# Patient Record
Sex: Male | Born: 1985 | Race: Black or African American | Hispanic: No | Marital: Single | Smoking: Never smoker
Health system: Southern US, Community
[De-identification: ages and names within clinical notes are randomized; demographics above are authoritative.]

---
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
## 2009-06-30 ENCOUNTER — Emergency Department: Payer: Self-pay | Admitting: Emergency Medicine

## 2012-11-30 ENCOUNTER — Emergency Department: Payer: Self-pay | Admitting: Emergency Medicine

## 2013-07-03 ENCOUNTER — Emergency Department: Payer: Self-pay | Admitting: Emergency Medicine

## 2013-11-25 ENCOUNTER — Emergency Department: Payer: Self-pay | Admitting: Emergency Medicine

## 2014-07-12 ENCOUNTER — Emergency Department: Payer: Self-pay | Admitting: Emergency Medicine

## 2014-07-12 LAB — CBC WITH DIFFERENTIAL/PLATELET
Basophil #: 0 10*3/uL (ref 0.0–0.1)
Basophil %: 0.5 %
Eosinophil #: 0.1 10*3/uL (ref 0.0–0.7)
Eosinophil %: 1 %
HCT: 40.9 % (ref 40.0–52.0)
HGB: 13.5 g/dL (ref 13.0–18.0)
Lymphocyte #: 5.2 10*3/uL — ABNORMAL HIGH (ref 1.0–3.6)
Lymphocyte %: 63.2 %
MCH: 30 pg (ref 26.0–34.0)
MCHC: 32.9 g/dL (ref 32.0–36.0)
MCV: 91 fL (ref 80–100)
Monocyte #: 0.7 x10 3/mm (ref 0.2–1.0)
Monocyte %: 8.7 %
Neutrophil #: 2.2 10*3/uL (ref 1.4–6.5)
Neutrophil %: 26.6 %
Platelet: 237 10*3/uL (ref 150–440)
RBC: 4.49 10*6/uL (ref 4.40–5.90)
RDW: 14 % (ref 11.5–14.5)
WBC: 8.3 10*3/uL (ref 3.8–10.6)

## 2014-07-12 LAB — COMPREHENSIVE METABOLIC PANEL
Albumin: 3.9 g/dL (ref 3.4–5.0)
Alkaline Phosphatase: 54 U/L
Anion Gap: 9 (ref 7–16)
BUN: 13 mg/dL (ref 7–18)
Bilirubin,Total: 0.5 mg/dL (ref 0.2–1.0)
Calcium, Total: 8 mg/dL — ABNORMAL LOW (ref 8.5–10.1)
Chloride: 107 mmol/L (ref 98–107)
Co2: 26 mmol/L (ref 21–32)
Creatinine: 1.25 mg/dL (ref 0.60–1.30)
EGFR (African American): >60
EGFR (Non-African Amer.): >60
Glucose: 138 mg/dL — ABNORMAL HIGH (ref 65–99)
Osmolality: 285 (ref 275–301)
Potassium: 3.5 mmol/L (ref 3.5–5.1)
SGOT(AST): 40 U/L — ABNORMAL HIGH (ref 15–37)
SGPT (ALT): 26 U/L
Sodium: 142 mmol/L (ref 136–145)
Total Protein: 7.4 g/dL (ref 6.4–8.2)

## 2014-07-12 LAB — PROTIME-INR
INR: 1.1
Prothrombin Time: 13.7 secs (ref 11.5–14.7)

## 2014-07-19 ENCOUNTER — Emergency Department: Payer: Self-pay | Admitting: Internal Medicine

## 2015-06-04 ENCOUNTER — Emergency Department: Payer: Managed Care, Other (non HMO)

## 2015-06-04 ENCOUNTER — Emergency Department
Admission: EM | Admit: 2015-06-04 | Discharge: 2015-06-04 | Disposition: A | Discharge status: Home / Self Care | Payer: Managed Care, Other (non HMO) | Attending: Emergency Medicine | Admitting: Emergency Medicine

## 2015-06-04 DIAGNOSIS — Y998 Other external cause status: Secondary | ICD-10-CM | POA: Diagnosis not present

## 2015-06-04 DIAGNOSIS — S9031XA Contusion of right foot, initial encounter: Secondary | ICD-10-CM | POA: Insufficient documentation

## 2015-06-04 DIAGNOSIS — W109XXA Fall (on) (from) unspecified stairs and steps, initial encounter: Secondary | ICD-10-CM | POA: Diagnosis not present

## 2015-06-04 DIAGNOSIS — S99921A Unspecified injury of right foot, initial encounter: Secondary | ICD-10-CM | POA: Diagnosis present

## 2015-06-04 DIAGNOSIS — Y9301 Activity, walking, marching and hiking: Secondary | ICD-10-CM | POA: Insufficient documentation

## 2015-06-04 DIAGNOSIS — Y9289 Other specified places as the place of occurrence of the external cause: Secondary | ICD-10-CM | POA: Diagnosis not present

## 2015-06-04 MED ORDER — IBUPROFEN 800 MG PO TABS: 800.0000 mg | ORAL_TABLET | Freq: Three times a day (TID) | ORAL | Status: DC

## 2015-06-04 NOTE — ED Notes (Signed)
Patient was walking yesterday and slipped down the steps and injured right foot. Pain is localized to right heel. 3+ dorsalis pedis.  No swelling noted upon assessment.

## 2015-06-04 NOTE — Discharge Instructions (Signed)
Contusion °A contusion is a deep bruise. Contusions happen when an injury causes bleeding under the skin. Signs of bruising include pain, puffiness (swelling), and discolored skin. The contusion may turn blue, purple, or yellow. °HOME CARE  °· Put ice on the injured area. °¨ Put ice in a plastic bag. °¨ Place a towel between your skin and the bag. °¨ Leave the ice on for 15-20 minutes, 03-04 times a day. °· Only take medicine as told by your doctor. °· Rest the injured area. °· If possible, raise (elevate) the injured area to lessen puffiness. °GET HELP RIGHT AWAY IF:  °· You have more bruising or puffiness. °· You have pain that is getting worse. °· Your puffiness or pain is not helped by medicine. °MAKE SURE YOU:  °· Understand these instructions. °· Will watch your condition. °· Will get help right away if you are not doing well or get worse. °Document Released: 02/08/2008 Document Revised: 11/14/2011 Document Reviewed: 06/27/2011 °ExitCare® Patient Information ©2015 [employer]. This information is not intended to replace advice given to you by your health care provider. Make sure you discuss any questions you have with your health care provider. ° °

## 2015-06-04 NOTE — ED Provider Notes (Signed)
[facility] Emergency Department Provider Note  ____________________________________________  Time seen: Approximately 12:40 PM  I have reviewed the triage vital signs and the nursing notes.   HISTORY  Chief Complaint Foot Pain   HPI Dalton Werner is a 29 y.o. male states that he was walking yesterday and slipped down some steps with an injury to his right heel. He continues to have pain this morning. He has not taken any over-the-counter medication for pain. He did not use ice or elevate last evening. He states pain was worse when he tried to walk today. He denies any previous injury to his foot, howeverhe states that "top of his foot" has been hurting for a couple weeks prior to this particular injury. He did not see any medical attention for that pain. Currently his pain is 10 out of 10. Pain is worse with weightbearing.   No past medical history on file.  There are no active problems to display for this patient.   No past surgical history on file.  Current Outpatient Rx  Name  Route  Sig  Dispense  Refill  . ibuprofen (ADVIL,MOTRIN) 800 MG tablet   Oral   Take 1 tablet (800 mg total) by mouth 3 (three) times daily.   30 tablet   0     Allergies Review of patient's allergies indicates no known allergies.  No family history on file.  Social History Social History  Substance Use Topics  . Smoking status: Never Smoker   . Smokeless tobacco: Not on file  . Alcohol Use: Yes    Review of Systems Constitutional: No fever/chills Cardiovascular: Denies chest pain. Respiratory: Denies shortness of breath. Gastrointestinal:   No nausea, no vomiting.  Genitourinary: Negative for dysuria. Musculoskeletal: Negative for back pain. Right foot pain positive Skin: Negative for rash. Neurological: Negative for headaches, focal weakness or numbness.  10-point ROS otherwise negative.  ____________________________________________   PHYSICAL  EXAM:  VITAL SIGNS: ED Triage Vitals  Enc Vitals Group     BP 06/04/15 1142 137/73 mmHg     Pulse Rate 06/04/15 1142 63     Resp 06/04/15 1142 18     Temp 06/04/15 1142 98.2 F (36.8 C)     Temp Source 06/04/15 1142 Oral     SpO2 06/04/15 1142 99 %     Weight 06/04/15 1142 240 lb (108.863 kg)     Height 06/04/15 1142  (1.803 m)     Head Cir --      Peak Flow --      Pain Score 06/04/15 1143 10     Pain Loc --      Pain Edu? --      Excl. in GC? --     Constitutional: Alert and oriented. Well appearing and in no acute distress. Eyes: Conjunctivae are normal. PERRL. EOMI. Head: Atraumatic. Nose: No congestion/rhinnorhea. Neck: No stridor.   Cardiovascular: Normal rate, regular rhythm. Grossly normal heart sounds.  Good peripheral circulation. Respiratory: Normal respiratory effort.  No retractions. Lungs CTAB. Gastrointestinal: Soft and nontender. No distention. Musculoskeletal: Right foot no gross deformity. There is moderate tenderness on palpation of the plantar aspect of the calcaneus. No edema or ecchymosis is noted. Ankle nontender bilaterally and no soft tissue edema.   No joint effusions. Neurologic:  Normal speech and language. No gross focal neurologic deficits are appreciated. No gait instability. Skin:  Skin is warm, dry and intact. No rash noted. No abrasions, ecchymosis or erythema noted. Psychiatric: Mood  and affect are normal. Speech and behavior are normal.  ____________________________________________   LABS (all labs ordered are listed, but only abnormal results are displayed)  Labs Reviewed - No data to display   RADIOLOGY  Right foot x-ray per radiologist reviewed by me show no fracture ____________________________________________   PROCEDURES  Procedure(s) performed: None  Critical Care performed: No  ____________________________________________   INITIAL IMPRESSION / ASSESSMENT AND PLAN / ED COURSE  Pertinent labs & imaging  results that were available during my care of the patient were reviewed by me and considered in my medical decision making (see chart for details).  Patient was placed on ibuprofen 800 mg 3 times a day. I, Tommi Rumps, personally viewed and evaluated these images (plain radiographs) as part of my medical decision making.  ____________________________________________   FINAL CLINICAL IMPRESSION(S) / ED DIAGNOSES  Final diagnoses:  Contusion of right foot, initial encounter      Tommi Rumps, PA-C 06/04/15 1335  Myrna Blazer, MD 06/04/15 1431

## 2017-02-02 ENCOUNTER — Emergency Department
Admission: EM | Admit: 2017-02-02 | Discharge: 2017-02-02 | Disposition: A | Discharge status: Home / Self Care | Payer: Managed Care, Other (non HMO) | Attending: Emergency Medicine | Admitting: Emergency Medicine

## 2017-02-02 ENCOUNTER — Emergency Department: Payer: Managed Care, Other (non HMO)

## 2017-02-02 ENCOUNTER — Encounter: Payer: Self-pay | Admitting: *Deleted

## 2017-02-02 DIAGNOSIS — W208XXA Other cause of strike by thrown, projected or falling object, initial encounter: Secondary | ICD-10-CM | POA: Insufficient documentation

## 2017-02-02 DIAGNOSIS — Y990 Civilian activity done for income or pay: Secondary | ICD-10-CM | POA: Insufficient documentation

## 2017-02-02 DIAGNOSIS — S9032XA Contusion of left foot, initial encounter: Secondary | ICD-10-CM | POA: Insufficient documentation

## 2017-02-02 DIAGNOSIS — Y9389 Activity, other specified: Secondary | ICD-10-CM | POA: Insufficient documentation

## 2017-02-02 DIAGNOSIS — Y9259 Other trade areas as the place of occurrence of the external cause: Secondary | ICD-10-CM | POA: Insufficient documentation

## 2017-02-02 MED ORDER — OXYCODONE-ACETAMINOPHEN 5-325 MG PO TABS
1.0000 | ORAL_TABLET | Freq: Once | ORAL | Status: AC
  Administered 2017-02-02: 1 via ORAL
  Filled 2017-02-02: qty 1

## 2017-02-02 MED ORDER — LIDOCAINE 5 % EX PTCH
1.0000 | MEDICATED_PATCH | CUTANEOUS | Status: DC
  Administered 2017-02-02: 1 via TRANSDERMAL
  Filled 2017-02-02: qty 1

## 2017-02-02 MED ORDER — IBUPROFEN 800 MG PO TABS: 800.0000 mg | ORAL_TABLET | Freq: Three times a day (TID) | ORAL | 0 refills | Status: DC | PRN

## 2017-02-02 NOTE — ED Triage Notes (Addendum)
Pt states he dropped a 30lb weight on left foot today at work.  Pt was wearing steel toed boot.  Pt has pain and swelling to foot.  States WC
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
## 2017-02-02 NOTE — ED Notes (Signed)
Workers comp completed 

## 2018-02-07 ENCOUNTER — Telehealth: Payer: Self-pay
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
## 2018-02-09 ENCOUNTER — Encounter: Payer: Self-pay | Admitting: Podiatry

## 2018-02-14 NOTE — Progress Notes (Signed)
This encounter was created in error - please disregard.

## 2018-11-19 ENCOUNTER — Other Ambulatory Visit: Payer: Self-pay

## 2018-11-19 ENCOUNTER — Encounter: Payer: Self-pay | Admitting: Emergency Medicine

## 2018-11-19 ENCOUNTER — Emergency Department
Admission: EM | Admit: 2018-11-19 | Discharge: 2018-11-19 | Disposition: A | Discharge status: Home / Self Care | Payer: No Typology Code available for payment source | Attending: Emergency Medicine | Admitting: Emergency Medicine

## 2018-11-19 DIAGNOSIS — Y9389 Activity, other specified: Secondary | ICD-10-CM | POA: Diagnosis not present

## 2018-11-19 DIAGNOSIS — T2016XA Burn of first degree of forehead and cheek, initial encounter: Secondary | ICD-10-CM | POA: Insufficient documentation

## 2018-11-19 DIAGNOSIS — Y929 Unspecified place or not applicable: Secondary | ICD-10-CM | POA: Insufficient documentation

## 2018-11-19 DIAGNOSIS — T2006XA Burn of unspecified degree of forehead and cheek, initial encounter: Secondary | ICD-10-CM | POA: Diagnosis present

## 2018-11-19 DIAGNOSIS — X18XXXA Contact with other hot metals, initial encounter: Secondary | ICD-10-CM | POA: Diagnosis not present

## 2018-11-19 DIAGNOSIS — Y990 Civilian activity done for income or pay: Secondary | ICD-10-CM | POA: Insufficient documentation

## 2018-11-19 DIAGNOSIS — T300 Burn of unspecified body region, unspecified degree: Secondary | ICD-10-CM

## 2018-11-19 MED ORDER — BACITRACIN-NEOMYCIN-POLYMYXIN 400-5-5000 EX OINT
TOPICAL_OINTMENT | Freq: Once | CUTANEOUS | Status: AC
  Administered 2018-11-19: 1 via TOPICAL
  Filled 2018-11-19: qty 2
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
## 2018-11-19 NOTE — ED Notes (Signed)
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
## 2019-02-18 ENCOUNTER — Other Ambulatory Visit: Payer: Self-pay

## 2019-02-18 ENCOUNTER — Emergency Department: Payer: Commercial Managed Care - PPO

## 2019-02-18 ENCOUNTER — Emergency Department
Admission: EM | Admit: 2019-02-18 | Discharge: 2019-02-18 | Disposition: A | Discharge status: Home / Self Care | Payer: Commercial Managed Care - PPO | Attending: Student in an Organized Health Care Education/Training Program | Admitting: Student in an Organized Health Care Education/Training Program

## 2019-02-18 ENCOUNTER — Encounter: Payer: Self-pay | Admitting: Emergency Medicine

## 2019-02-18 DIAGNOSIS — M545 Low back pain: Secondary | ICD-10-CM | POA: Diagnosis present

## 2019-02-18 DIAGNOSIS — M533 Sacrococcygeal disorders, not elsewhere classified: Secondary | ICD-10-CM | POA: Insufficient documentation

## 2019-02-18 MED ORDER — TRAMADOL HCL 50 MG PO TABS: 50.0000 mg | ORAL_TABLET | Freq: Four times a day (QID) | ORAL | 0 refills | Status: DC | PRN

## 2019-02-18 MED ORDER — NAPROXEN 500 MG PO TABS: 500.0000 mg | ORAL_TABLET | Freq: Two times a day (BID) | ORAL | Status: DC

## 2019-02-18 NOTE — ED Provider Notes (Signed)
[facility] Emergency Department Provider Note   ____________________________________________   First MD Initiated Contact with Patient 02/18/19 1118     (approximate)  I have reviewed the triage vital signs and the nursing notes.   HISTORY  Chief Complaint Back Pain    HPI Dalton Werner is a 33 y.o. male patient complain of 1 year of coccyx pain that increased with sitting on hard surface.  Patient denies trauma to the area.  Patient did pain increased yesterday.  Patient denies radicular component to pain.  Patient denies bladder bowel dysfunction.  Patient rates pain as a 5/10.  Patient described the pain is "sharp".  No palliative measure for complaint.         History reviewed. No pertinent past medical history.  There are no active problems to display for this patient.   History reviewed. No pertinent surgical history.  Prior to Admission medications   Medication Sig Start Date End Date Taking? Authorizing Provider  naproxen (NAPROSYN) 500 MG tablet Take 1 tablet (500 mg total) by mouth 2 (two) times daily with a meal. 02/18/19   Joni ReiningSmith, Bolden Hagerman K, PA-C  traMADol (ULTRAM) 50 MG tablet Take 1 tablet (50 mg total) by mouth every 6 (six) hours as needed. 02/18/19   Joni ReiningSmith, Sareena Odeh K, PA-C    Allergies Patient has no known allergies.  No family history on file.  Social History Social History   Tobacco Use   Smoking status: Never Smoker   Smokeless tobacco: Never Used  Substance Use Topics   Alcohol use: Yes   Drug use: No    Review of Systems Constitutional: No fever/chills Eyes: No visual changes. ENT: No sore throat. Cardiovascular: Denies chest pain. Respiratory: Denies shortness of breath. Gastrointestinal: No abdominal pain.  No nausea, no vomiting.  No diarrhea.  No constipation. Genitourinary: Negative for dysuria. Musculoskeletal: Coccyx pain. Skin: Negative for rash. Neurological: Negative for headaches, focal weakness  or numbness.   ____________________________________________   PHYSICAL EXAM:  VITAL SIGNS: ED Triage Vitals  Enc Vitals Group     BP 02/18/19 1016 (!) 112/98     Pulse Rate 02/18/19 1016 71     Resp --      Temp 02/18/19 1016 98.2 F (36.8 C)     Temp Source 02/18/19 1016 Oral     SpO2 02/18/19 1016 98 %     Weight 02/18/19 1019 260 lb (117.9 kg)     Height 02/18/19 1019 5\' 11"  (1.803 m)     Head Circumference --      Peak Flow --      Pain Score 02/18/19 1019 5     Pain Loc --      Pain Edu? --      Excl. in GC? --    Constitutional: Alert and oriented. Well appearing and in no acute distress. Cardiovascular: Normal rate, regular rhythm. Grossly normal heart sounds.  Good peripheral circulation. Respiratory: Normal respiratory effort.  No retractions. Lungs CTAB. Gastrointestinal: Soft and nontender. No distention. No abdominal bruits. No CVA tenderness. Genitourinary: Deferred Musculoskeletal: No obvious lumbar spine deformity.  Patient has moderate guarding palpation L5-S1.  Patient has full equal range of motion.  No lower extremity tenderness nor edema.  Patient has negative bilateral straight leg test. Neurologic:  Normal speech and language. No gross focal neurologic deficits are appreciated. No gait instability. Skin:  Skin is warm, dry and intact. No rash noted. Psychiatric: Mood and affect are normal. Speech and behavior are  normal.  ____________________________________________   LABS (all labs ordered are listed, but only abnormal results are displayed)  Labs Reviewed - No data to display ____________________________________________  EKG   ____________________________________________  RADIOLOGY  ED MD interpretation:    Official radiology report(s): Dg Lumbar Spine 2-3 Views  Result Date: 02/18/2019 CLINICAL DATA:  33 year old male with a history of lower back pain EXAM: LUMBAR SPINE - 2-3 VIEW COMPARISON:  None. FINDINGS: Lumbar Spine: Lumbar  vertebral elements maintain normal alignment without evidence of anterolisthesis, retrolisthesis, subluxation. No acute fracture line identified. Lumbar vertebral body heights maintained. Mild wedging of the T12 level, likely chronic. Disc space heights maintained with no significant degenerative disc disease or endplate changes. No significant facet changes. Unremarkable appearance of the visualized abdomen. IMPRESSION: Negative for acute fracture or malalignment of the lumbar spine. Electronically Signed   By: Corrie Mckusick D.O.   On: 02/18/2019 12:00    ____________________________________________   PROCEDURES  Procedure(s) performed (including Critical Care):  Procedures   ____________________________________________   INITIAL IMPRESSION / ASSESSMENT AND PLAN / ED COURSE  As part of my medical decision making, I reviewed the following data within the [place]         Patient presents 1 year of constant pain.  Patient denies cauda equina signs or symptoms.  Differential consist of degenerative changes versus occult fracture.  Physical exam was grossly unremarkable.  Imaging shows no acute findings.  Patient given discharge care instructions.  Patient state has appoint with PCP in 3 days.     ____________________________________________   FINAL CLINICAL IMPRESSION(S) / ED DIAGNOSES  Final diagnoses:  Coccydynia     ED Discharge Orders         Ordered    naproxen (NAPROSYN) 500 MG tablet  2 times daily with meals     02/18/19 1220    traMADol (ULTRAM) 50 MG tablet  Every 6 hours PRN     02/18/19 1220           Note:  This document was prepared using Dragon voice recognition software and may include unintentional dictation errors.    Sable Feil, PA-C 02/18/19 1223    Merlyn Lot, MD 02/18/19 [phone]

## 2019-02-18 NOTE — Discharge Instructions (Signed)
Advised to purchase doughnut seat cushion.  Follow discharge care instruction take medication as directed.  Follow-up with scheduled doctor appointment.

## 2019-02-18 NOTE — ED Notes (Signed)
See triage note  Presents with back pain  States pain is mainly at tailbone  Worse with sitting still  Improves with ambulation   Denies any injury

## 2019-02-18 NOTE — ED Triage Notes (Signed)
Pt reports for the past year or more he will have pain in his lower back, tailbone area when he sits on a hard surface. Pt states yesterday it got worse and was hurting while he was laying. Pt denies injuries or other sx's.

## 2019-02-28 ENCOUNTER — Ambulatory Visit: Payer: Commercial Managed Care - PPO | Admitting: Adult Health

## 2019-02-28 ENCOUNTER — Other Ambulatory Visit: Payer: Self-pay

## 2019-02-28 ENCOUNTER — Encounter: Payer: Self-pay | Admitting: Adult Health

## 2019-02-28 VITALS — BP 124/89 | HR 79 | Resp 16 | Ht 71.0 in | Wt 272.0 lb

## 2019-02-28 DIAGNOSIS — M545 Low back pain, unspecified: Secondary | ICD-10-CM

## 2019-02-28 DIAGNOSIS — M533 Sacrococcygeal disorders, not elsewhere classified: Secondary | ICD-10-CM

## 2019-02-28 DIAGNOSIS — E6609 Other obesity due to excess calories: Secondary | ICD-10-CM

## 2019-02-28 DIAGNOSIS — G8929 Other chronic pain: Secondary | ICD-10-CM | POA: Diagnosis not present

## 2019-02-28 DIAGNOSIS — Z6837 Body mass index (BMI) 37.0-37.9, adult: Secondary | ICD-10-CM

## 2019-02-28 MED ORDER — NAPROXEN 500 MG PO TABS: 500.0000 mg | ORAL_TABLET | Freq: Two times a day (BID) | ORAL | Status: DC

## 2019-02-28 NOTE — Progress Notes (Signed)
[facility] [address] [place], [place] [address]  Internal MEDICINE  Office Visit Note  Patient Name: Dalton FergusonByron Werner  [id number] 24, 1987  [id number]  Date of Service: 02/28/2019   Complaints/HPI Pt is here for establishment of PCP. Chief Complaint  Patient presents with  . Medical Management of Chronic Issues    sitting on hard service having shooting pain in tailbone and rectum pain , ED gave some medications , having pressure in the rectum   . New Patient (Initial Visit)   HPI Pt is here to establish care.  He reports no significant medical history except for a work injury from last year that gives him chronic back pain and tightness. About 2 weeks ago he noticed some sharp pains come from his lower back and rectal area.  He denies any trauma.  He denies any tingling but does report some numbness at times. He says it feels rectal pressure at times like he needs to have a bowel movement, but is unable to.  He was seen in the ED for this, and diagnosed with Coccydynia.  He was given naproxen and tramadol and told to rest.  Pt reports he chews tobacco, drinks alcohol occasionally, and denies illicit drug use.   Current Medication: Outpatient Encounter Medications as of 02/28/2019  Medication Sig  . naproxen (NAPROSYN) 500 MG tablet Take 1 tablet (500 mg total) by mouth 2 (two) times daily with a meal.  . traMADol (ULTRAM) 50 MG tablet Take 1 tablet (50 mg total) by mouth every 6 (six) hours as needed.   No facility-administered encounter medications on file as of 02/28/2019.     Surgical History: History reviewed. No pertinent surgical history.  Medical History: History reviewed. No pertinent past medical history.  Family History: Family History  Problem Relation Age of Onset  . Hypertension Mother   . Liver cancer Father     Social History   Socioeconomic History  . Marital status: Single    Spouse name: Not on file  . Number of children: Not on file  . Years of  education: Not on file  . Highest education level: Not on file  Occupational History  . Not on file  Social Needs  . Financial resource strain: Not on file  . Food insecurity    Worry: Not on file    Inability: Not on file  . Transportation needs    Medical: Not on file    Non-medical: Not on file  Tobacco Use  . Smoking status: Never Smoker  . Smokeless tobacco: Current User    Types: Chew  Substance and Sexual Activity  . Alcohol use: Yes  . Drug use: No  . Sexual activity: Not on file  Lifestyle  . Physical activity    Days per week: Not on file    Minutes per session: Not on file  . Stress: Not on file  Relationships  . Social Musicianconnections    Talks on phone: Not on file    Gets together: Not on file    Attends religious service: Not on file    Active member of club or organization: Not on file    Attends meetings of clubs or organizations: Not on file    Relationship status: Not on file  . Intimate partner violence    Fear of current or ex partner: Not on file    Emotionally abused: Not on file    Physically abused: Not on file    Forced sexual activity: Not  on file  Other Topics Concern  . Not on file  Social History Narrative  . Not on file     Review of Systems  Constitutional: Negative.  Negative for chills, fatigue and unexpected weight change.  HENT: Negative.  Negative for congestion, rhinorrhea, sneezing and sore throat.   Eyes: Negative for redness.  Respiratory: Negative.  Negative for cough, chest tightness and shortness of breath.   Cardiovascular: Negative.  Negative for chest pain and palpitations.  Gastrointestinal: Negative.  Negative for abdominal pain, constipation, diarrhea, nausea and vomiting.  Endocrine: Negative.   Genitourinary: Negative.  Negative for dysuria and frequency.  Musculoskeletal: Negative.  Negative for arthralgias, back pain, joint swelling and neck pain.  Skin: Negative.  Negative for rash.  Allergic/Immunologic:  Negative.   Neurological: Negative.  Negative for tremors and numbness.  Hematological: Negative for adenopathy. Does not bruise/bleed easily.  Psychiatric/Behavioral: Negative.  Negative for behavioral problems, sleep disturbance and suicidal ideas. The patient is not nervous/anxious.     Vital Signs: BP 124/89   Pulse 79   Resp 16   Ht 5\' 11"  (1.803 m)   Wt 272 lb (123.4 kg)   SpO2 95%   BMI 37.94 kg/m    Physical Exam Vitals signs and nursing note reviewed.  Constitutional:      General: He is not in acute distress.    Appearance: He is well-developed. He is not diaphoretic.  HENT:     Head: Normocephalic and atraumatic.     Mouth/Throat:     Pharynx: No oropharyngeal exudate.  Eyes:     Pupils: Pupils are equal, round, and reactive to light.  Neck:     Musculoskeletal: Normal range of motion and neck supple.     Thyroid: No thyromegaly.     Vascular: No JVD.     Trachea: No tracheal deviation.  Cardiovascular:     Rate and Rhythm: Normal rate and regular rhythm.     Heart sounds: Normal heart sounds. No murmur. No friction rub. No gallop.   Pulmonary:     Effort: Pulmonary effort is normal. No respiratory distress.     Breath sounds: Normal breath sounds. No wheezing or rales.  Chest:     Chest wall: No tenderness.  Abdominal:     Palpations: Abdomen is soft.     Tenderness: There is no abdominal tenderness. There is no guarding.  Musculoskeletal: Normal range of motion.     Comments: Lower back pain with palpation  Lymphadenopathy:     Cervical: No cervical adenopathy.  Skin:    General: Skin is warm and dry.  Neurological:     Mental Status: He is alert and oriented to person, place, and time.     Cranial Nerves: No cranial nerve deficit.  Psychiatric:        Behavior: Behavior normal.        Thought Content: Thought content normal.        Judgment: Judgment normal.    Assessment/Plan: 1. Coccydynia Continue present treatment, continue to rest, if no  better in 2 weeks follow up in office.   2. Chronic bilateral low back pain without sciatica Continue to use naproxen for low back pain as directed.  We discussed to side effects of long term use of naproxen, pt verbalized understanding risk of Gi bleed etc.  - naproxen (NAPROSYN) 500 MG tablet; Take 1 tablet (500 mg total) by mouth 2 (two) times daily with a meal.  Dispense: 60 tablet; Refill: 00  3. Class 2 obesity due to excess calories without serious comorbidity with body mass index (BMI) of 37.0 to 37.9 in adult Obesity Counseling: Risk Assessment: An assessment of behavioral risk factors was made today and includes lack of exercise sedentary lifestyle, lack of portion control and poor dietary habits.  Risk Modification Advice: She was counseled on portion control guidelines. Restricting daily caloric intake to. . The detrimental long term effects of obesity on her health and ongoing poor compliance was also discussed with the patient.    General Counseling: Tobe SosByron verbalizes understanding of the findings of todays visit and agrees with plan of treatment. I have discussed any further diagnostic evaluation that may be needed or ordered today. We also reviewed his medications today. he has been encouraged to call the office with any questions or concerns that should arise related to todays visit.  No orders of the defined types were placed in this encounter.   No orders of the defined types were placed in this encounter.   Time spent: 30 Minutes   This patient was seen by Blima LedgerAdam Gisselle Galvis AGNP-C in Collaboration with Dr Lyndon CodeFozia M Khan as a part of collaborative care agreement  Johnna AcostaAdam J. Soha Thorup AGNP-C Internal Medicine

## 2019-03-06 ENCOUNTER — Ambulatory Visit: Payer: Self-pay | Admitting: Adult Health

## 2019-03-13 ENCOUNTER — Telehealth: Payer: Self-pay | Admitting: Adult Health

## 2019-03-13 NOTE — Telephone Encounter (Signed)
Provider (adam scarboro) has paperwork pt dropped off to be completed for his work place. Beth

## 2019-03-19 ENCOUNTER — Telehealth: Payer: Self-pay

## 2019-03-19 NOTE — Telephone Encounter (Signed)
Left message and advised patient his paperwork is ready for pick up and in the future if he has flare up's he needs to let our office know so we can document for the future if he will need dates filled out we need the documentation in chart. Beth

## 2019-03-26 ENCOUNTER — Other Ambulatory Visit: Payer: Self-pay | Admitting: Adult Health

## 2019-03-26 DIAGNOSIS — G8929 Other chronic pain: Secondary | ICD-10-CM

## 2019-03-26 NOTE — Telephone Encounter (Signed)
Pt needs appt for further refills. 

## 2019-04-04 ENCOUNTER — Other Ambulatory Visit: Payer: Self-pay

## 2019-04-04 ENCOUNTER — Encounter: Payer: Self-pay | Admitting: Adult Health

## 2019-04-04 ENCOUNTER — Ambulatory Visit (INDEPENDENT_AMBULATORY_CARE_PROVIDER_SITE_OTHER): Payer: Commercial Managed Care - PPO | Admitting: Adult Health

## 2019-04-04 VITALS — BP 133/84 | HR 71 | Resp 16 | Ht 71.0 in | Wt 279.0 lb

## 2019-04-04 DIAGNOSIS — Z0001 Encounter for general adult medical examination with abnormal findings: Secondary | ICD-10-CM

## 2019-04-04 DIAGNOSIS — Z6837 Body mass index (BMI) 37.0-37.9, adult: Secondary | ICD-10-CM

## 2019-04-04 DIAGNOSIS — M533 Sacrococcygeal disorders, not elsewhere classified: Secondary | ICD-10-CM | POA: Diagnosis not present

## 2019-04-04 DIAGNOSIS — R300 Dysuria: Secondary | ICD-10-CM

## 2019-04-04 DIAGNOSIS — E6609 Other obesity due to excess calories: Secondary | ICD-10-CM | POA: Diagnosis not present

## 2019-04-04 NOTE — Progress Notes (Signed)
[facility] [address] [place], [place] [address]  Internal MEDICINE  Office Visit Note  Patient Name: Dalton FergusonByron Werner  [id number] 20, 1987  [id number]  Date of Service: 04/04/2019  Chief Complaint  Patient presents with  . Medical Management of Chronic Issues  . Annual Exam  . Back Pain    off and on      HPI Pt is here for routine health maintenance examination.  He is a well appearing 33 yo AA male. He denies any new complaints at this time. His low back pain, and  Coccydynia has mostly resolved.  He reports one episode of flare up with a hard bowel movement about a week ago.  He took naproxen, and the pain has resolved.   Current Medication: Outpatient Encounter Medications as of 04/04/2019  Medication Sig  . naproxen (NAPROSYN) 500 MG tablet TAKE 1 TABLET (500 MG TOTAL) BY MOUTH 2 (TWO) TIMES DAILY WITH A MEAL.  Marland Kitchen. traMADol (ULTRAM) 50 MG tablet Take 1 tablet (50 mg total) by mouth every 6 (six) hours as needed.   No facility-administered encounter medications on file as of 04/04/2019.     Surgical History: History reviewed. No pertinent surgical history.  Medical History: History reviewed. No pertinent past medical history.  Family History: Family History  Problem Relation Age of Onset  . Hypertension Mother   . Liver cancer Father       Review of Systems  Constitutional: Negative.  Negative for chills, fatigue and unexpected weight change.  HENT: Negative.  Negative for congestion, rhinorrhea, sneezing and sore throat.   Eyes: Negative for redness.  Respiratory: Negative.  Negative for cough, chest tightness and shortness of breath.   Cardiovascular: Negative.  Negative for chest pain and palpitations.  Gastrointestinal: Negative.  Negative for abdominal pain, constipation, diarrhea, nausea and vomiting.  Endocrine: Negative.   Genitourinary: Negative.  Negative for dysuria and frequency.  Musculoskeletal: Negative.  Negative for arthralgias, back pain,  joint swelling and neck pain.  Skin: Negative.  Negative for rash.  Allergic/Immunologic: Negative.   Neurological: Negative.  Negative for tremors and numbness.  Hematological: Negative for adenopathy. Does not bruise/bleed easily.  Psychiatric/Behavioral: Negative.  Negative for behavioral problems, sleep disturbance and suicidal ideas. The patient is not nervous/anxious.      Vital Signs: BP 133/84   Pulse 71   Resp 16   Ht 5\' 11"  (1.803 m)   Wt 279 lb (126.6 kg)   SpO2 96%   BMI 38.91 kg/m    Physical Exam Vitals signs and nursing note reviewed.  Constitutional:      General: He is not in acute distress.    Appearance: He is well-developed. He is not diaphoretic.  HENT:     Head: Normocephalic and atraumatic.     Mouth/Throat:     Pharynx: No oropharyngeal exudate.  Eyes:     Pupils: Pupils are equal, round, and reactive to light.  Neck:     Musculoskeletal: Normal range of motion and neck supple.     Thyroid: No thyromegaly.     Vascular: No JVD.     Trachea: No tracheal deviation.  Cardiovascular:     Rate and Rhythm: Normal rate and regular rhythm.     Heart sounds: Normal heart sounds. No murmur. No friction rub. No gallop.   Pulmonary:     Effort: Pulmonary effort is normal. No respiratory distress.     Breath sounds: Normal breath sounds. No wheezing or rales.  Chest:  Chest wall: No tenderness.  Abdominal:     Palpations: Abdomen is soft.     Tenderness: There is no abdominal tenderness. There is no guarding.  Musculoskeletal: Normal range of motion.  Lymphadenopathy:     Cervical: No cervical adenopathy.  Skin:    General: Skin is warm and dry.  Neurological:     Mental Status: He is alert and oriented to person, place, and time.     Cranial Nerves: No cranial nerve deficit.  Psychiatric:        Behavior: Behavior normal.        Thought Content: Thought content normal.        Judgment: Judgment normal.      LABS: No results found for this  or any previous visit (from the past 2160 hour(s)).   Assessment/Plan: 1. Encounter for general adult medical examination with abnormal finding Pt is up to date on PHM.  - CBC with Differential/Platelet - Lipid Panel With LDL/HDL Ratio - TSH - T4, free - Comprehensive metabolic panel  2. Coccydynia Improving gradually.  No current issues.  Continue naproxen as needed.   3. Class 2 obesity due to excess calories without serious comorbidity with body mass index (BMI) of 37.0 to 37.9 in adult Obesity Counseling: Risk Assessment: An assessment of behavioral risk factors was made today and includes lack of exercise sedentary lifestyle, lack of portion control and poor dietary habits.  Risk Modification Advice: She was counseled on portion control guidelines. Restricting daily caloric intake to. . The detrimental long term effects of obesity on her health and ongoing poor compliance was also discussed with the patient.  4. Dysuria - UA/M w/rflx Culture, Routine  General Counseling: [name] verbalizes understanding of the findings of todays visit and agrees with plan of treatment. I have discussed any further diagnostic evaluation that may be needed or ordered today. We also reviewed his medications today. he has been encouraged to call the office with any questions or concerns that should arise related to todays visit.   Orders Placed This Encounter  Procedures  . UA/M w/rflx Culture, Routine    No orders of the defined types were placed in this encounter.   Time spent: 25 Minutes   This patient was seen by Orson Gear AGNP-C in Collaboration with Dr Lavera Guise as a part of collaborative care agreement    Kendell Bane AGNP-C Internal Medicine

## 2019-04-05 LAB — UA/M W/RFLX CULTURE, ROUTINE
Bilirubin, UA: NEGATIVE
Glucose, UA: NEGATIVE
Ketones, UA: NEGATIVE
Leukocytes,UA: NEGATIVE
Nitrite, UA: NEGATIVE
Protein,UA: NEGATIVE
RBC, UA: NEGATIVE
Specific Gravity, UA: >=1.03 — AB (ref 1.005–1.030)
Urobilinogen, Ur: 0.2 mg/dL (ref 0.2–1.0)
pH, UA: 6 (ref 5.0–7.5)

## 2019-04-05 LAB — MICROSCOPIC EXAMINATION
Casts: NONE SEEN /lpf
Epithelial Cells (non renal): NONE SEEN /hpf (ref 0–10)

## 2019-04-09 ENCOUNTER — Encounter: Payer: Self-pay | Admitting: Adult Health

## 2019-04-11 ENCOUNTER — Encounter: Payer: Self-pay | Admitting: Adult Health

## 2019-05-16 ENCOUNTER — Ambulatory Visit: Payer: Commercial Managed Care - PPO | Admitting: Adult Health

## 2019-05-23 ENCOUNTER — Ambulatory Visit: Payer: Commercial Managed Care - PPO | Admitting: Adult Health

## 2019-05-23 ENCOUNTER — Other Ambulatory Visit: Payer: Self-pay

## 2019-05-23 ENCOUNTER — Encounter: Payer: Self-pay | Admitting: Adult Health

## 2019-05-23 DIAGNOSIS — Z20828 Contact with and (suspected) exposure to other viral communicable diseases: Secondary | ICD-10-CM | POA: Diagnosis not present

## 2019-05-23 DIAGNOSIS — Z20822 Contact with and (suspected) exposure to covid-19: Secondary | ICD-10-CM

## 2019-05-23 NOTE — Progress Notes (Signed)
[facility] [address] [place], [place] [address]  Internal MEDICINE  Telephone Visit  Patient Name: Dalton FergusonByron Sheffer  [birth year]  [id number]  Date of Service: 05/23/2019  I connected with the patient at 845 by telephone and verified the patients identity using two identifiers.   I discussed the limitations, risks, security and privacy concerns of performing an evaluation and management service by telephone and the availability of in person appointments. I also discussed with the patient that there may be a patient responsible charge related to the service.  The patient expressed understanding and agrees to proceed.    Chief Complaint  Patient presents with  . Telephone Screen  . Cough    chills , aching , started on tuesday, doing better today, not really having symptoms right now , having back pain   . Telephone Assessment    HPI  Pt seen via video.  PT reports 3 days ago he started having muscle aches, sore throat, chills, fever, sweats, sore throat.  He reports most of those symptoms subsided, and then developed back pain. He was tested for COVID-19 two days ago. He reports he will have results in 3-5 days. He denies taking any otc medications.     Current Medication: Outpatient Encounter Medications as of 05/23/2019  Medication Sig  . naproxen (NAPROSYN) 500 MG tablet TAKE 1 TABLET (500 MG TOTAL) BY MOUTH 2 (TWO) TIMES DAILY WITH A MEAL.  Marland Kitchen. traMADol (ULTRAM) 50 MG tablet Take 1 tablet (50 mg total) by mouth every 6 (six) hours as needed.   No facility-administered encounter medications on file as of 05/23/2019.     Surgical History: History reviewed. No pertinent surgical history.  Medical History: History reviewed. No pertinent past medical history.  Family History: Family History  Problem Relation Age of Onset  . Hypertension Mother   . Liver cancer Father     Social History   Socioeconomic History  . Marital status: Single    Spouse name: Not on  file  . Number of children: Not on file  . Years of education: Not on file  . Highest education level: Not on file  Occupational History  . Not on file  Social Needs  . Financial resource strain: Not on file  . Food insecurity    Worry: Not on file    Inability: Not on file  . Transportation needs    Medical: Not on file    Non-medical: Not on file  Tobacco Use  . Smoking status: Never Smoker  . Smokeless tobacco: Current User    Types: Chew  Substance and Sexual Activity  . Alcohol use: Yes  . Drug use: No  . Sexual activity: Not on file  Lifestyle  . Physical activity    Days per week: Not on file    Minutes per session: Not on file  . Stress: Not on file  Relationships  . Social Musicianconnections    Talks on phone: Not on file    Gets together: Not on file    Attends religious service: Not on file    Active member of club or organization: Not on file    Attends meetings of clubs or organizations: Not on file    Relationship status: Not on file  . Intimate partner violence    Fear of current or ex partner: Not on file    Emotionally abused: Not on file    Physically abused: Not on file    Forced sexual activity: Not  on file  Other Topics Concern  . Not on file  Social History Narrative  . Not on file      Review of Systems  Constitutional: Positive for chills and fever. Negative for fatigue and unexpected weight change.  HENT: Positive for sore throat. Negative for congestion, rhinorrhea and sneezing.   Eyes: Negative for redness.  Respiratory: Positive for cough. Negative for chest tightness and shortness of breath.   Cardiovascular: Negative.  Negative for chest pain and palpitations.  Gastrointestinal: Negative.  Negative for abdominal pain, constipation, diarrhea, nausea and vomiting.  Endocrine: Negative.   Genitourinary: Negative.  Negative for dysuria and frequency.  Musculoskeletal: Negative.  Negative for arthralgias, back pain, joint swelling and neck  pain.  Skin: Negative.  Negative for rash.  Allergic/Immunologic: Negative.   Neurological: Negative.  Negative for tremors and numbness.  Hematological: Negative for adenopathy. Does not bruise/bleed easily.  Psychiatric/Behavioral: Negative.  Negative for behavioral problems, sleep disturbance and suicidal ideas. The patient is not nervous/anxious.     Vital Signs: There were no vitals taken for this visit.   Observation/Objective:  Well appearing, NAD noted at this time.    Assessment/Plan: 1. Exposure to Covid-19 Virus Pt is feeling better at this time, he is in need of a work note at this time.  Which will be provided.  He will call us when his covid results come back,   General Counseling: Weslee verbalizes understanding of the findings of today's phone visit and agrees with plan of treatment. I have discussed any further diagnostic evaluation that may be needed or ordered today. We also reviewed his medications today. he has been encouraged to call the office with any questions or concerns that should arise related to todays visit.    No orders of the defined types were placed in this encounter.   No orders of the defined types were placed in this encounter.   Time spent: Camp Swift AGNP-C Internal medicine

## 2019-05-28 ENCOUNTER — Telehealth: Payer: Self-pay

## 2019-05-28 NOTE — Telephone Encounter (Signed)
Spoke with patient paperwork is ready to pick up copy and scan beth r.

## 2019-06-07 ENCOUNTER — Encounter: Payer: Self-pay | Admitting: Adult Health

## 2019-07-17 ENCOUNTER — Encounter: Payer: Self-pay | Admitting: Adult Health

## 2020-04-05 IMAGING — CR LUMBAR SPINE - 2-3 VIEW
3 series · 3 of 3 positions shown · non-contrast
Comparison: None.

CLINICAL DATA: 33-year-old male with a history of lower back pain

EXAM:
LUMBAR SPINE - 2-3 VIEW

[l-spine ap]
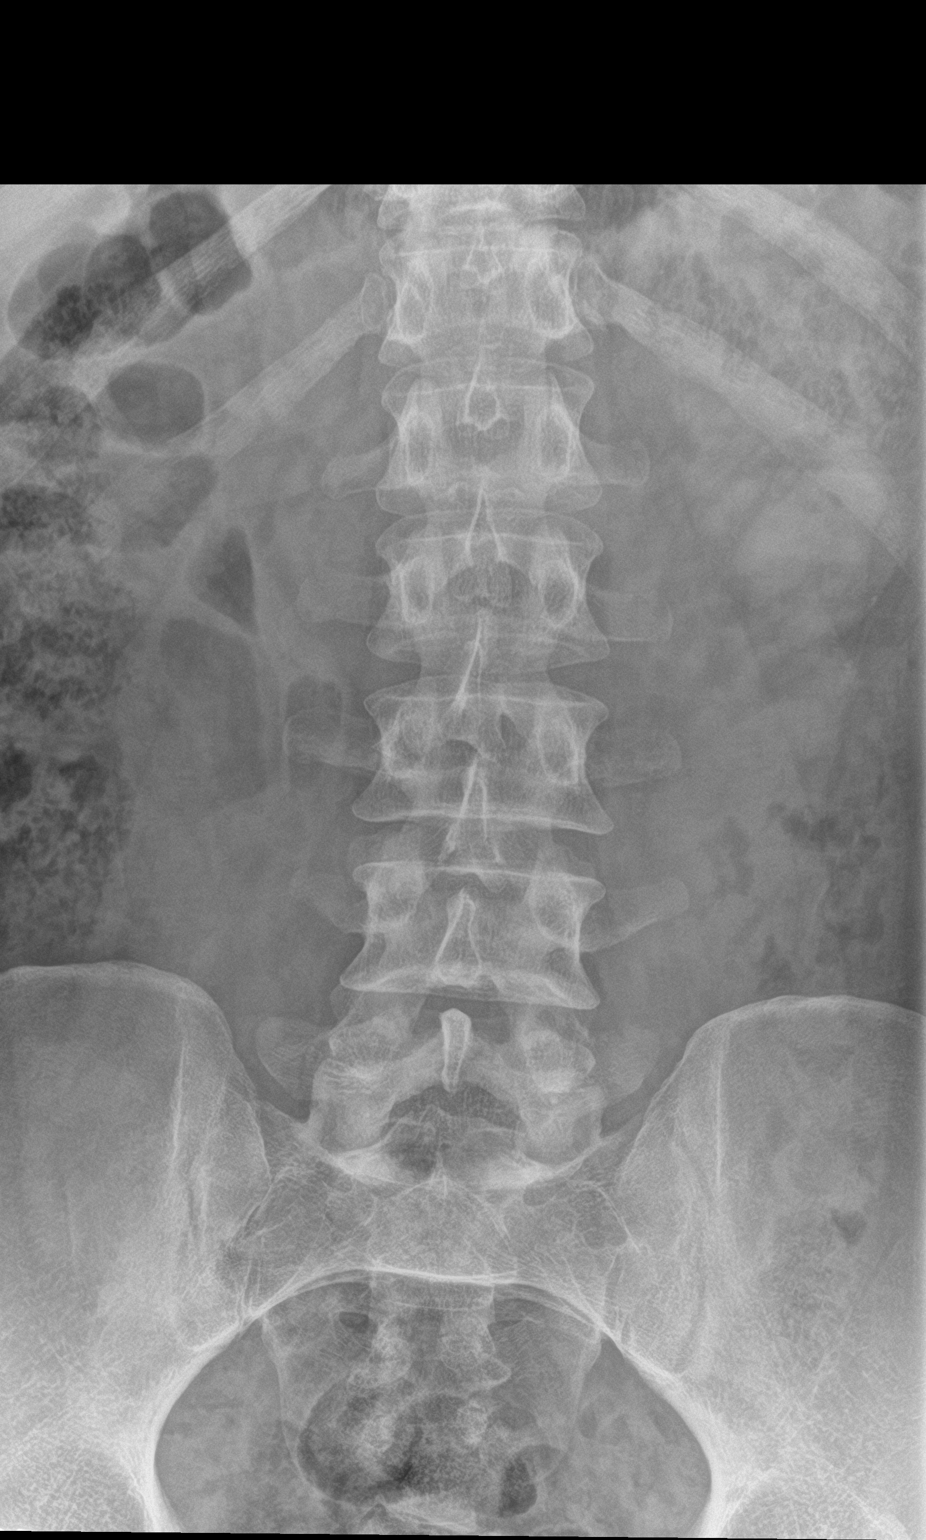

[l-spine lat]
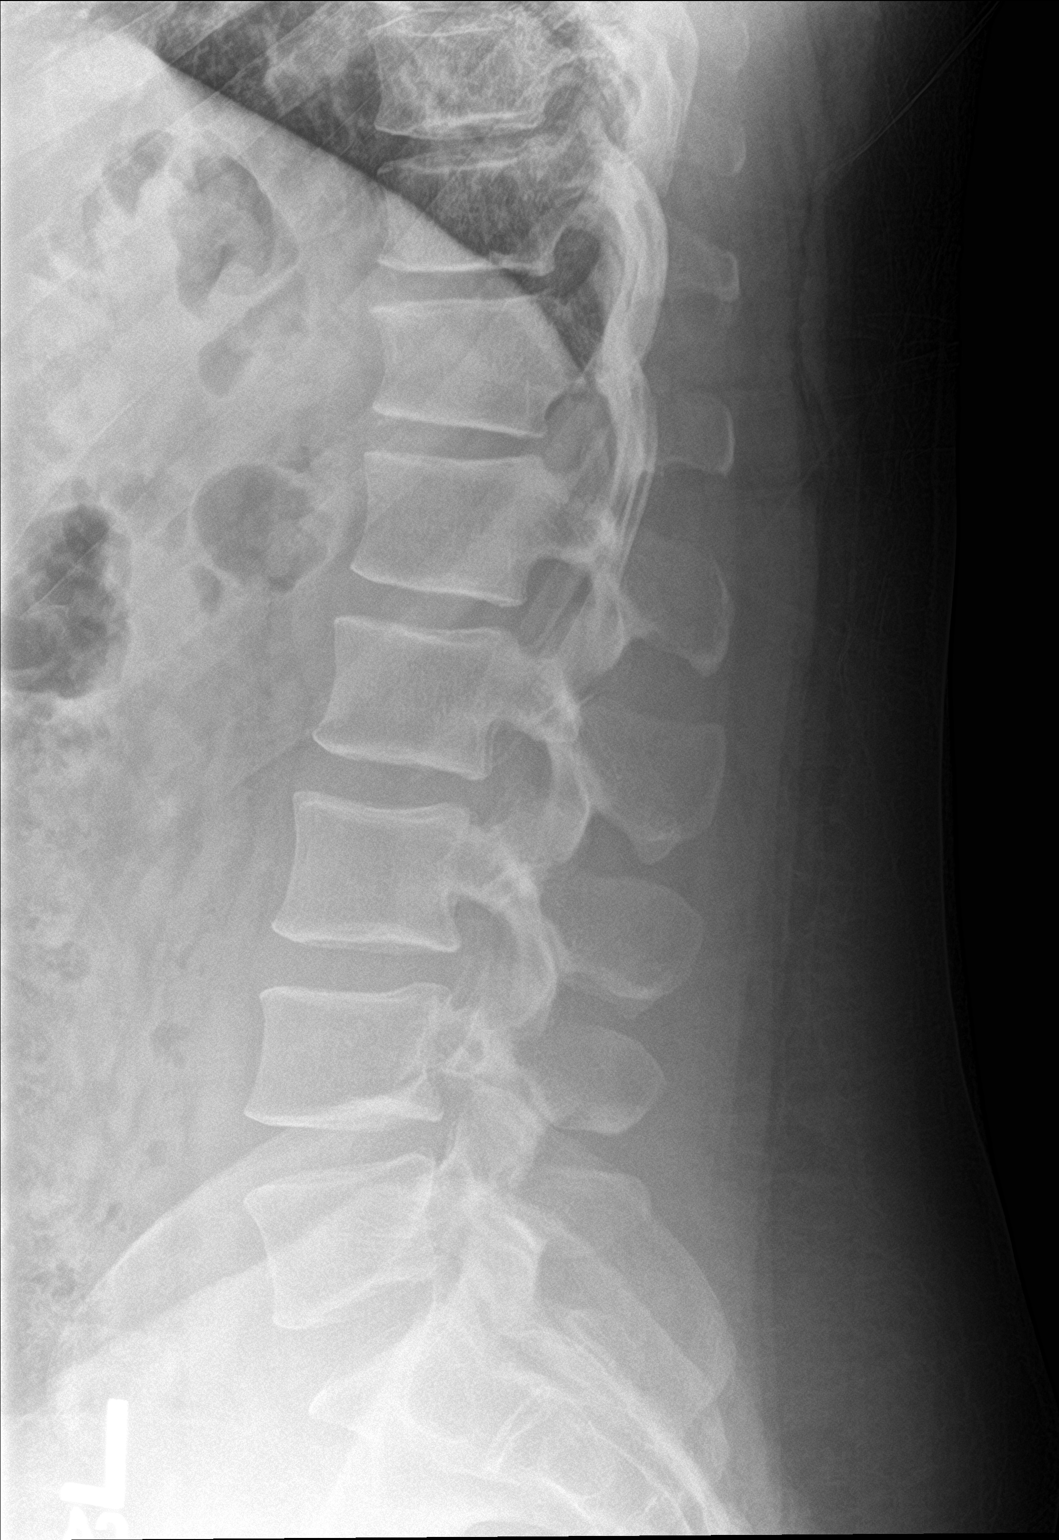

[l-spine spot]
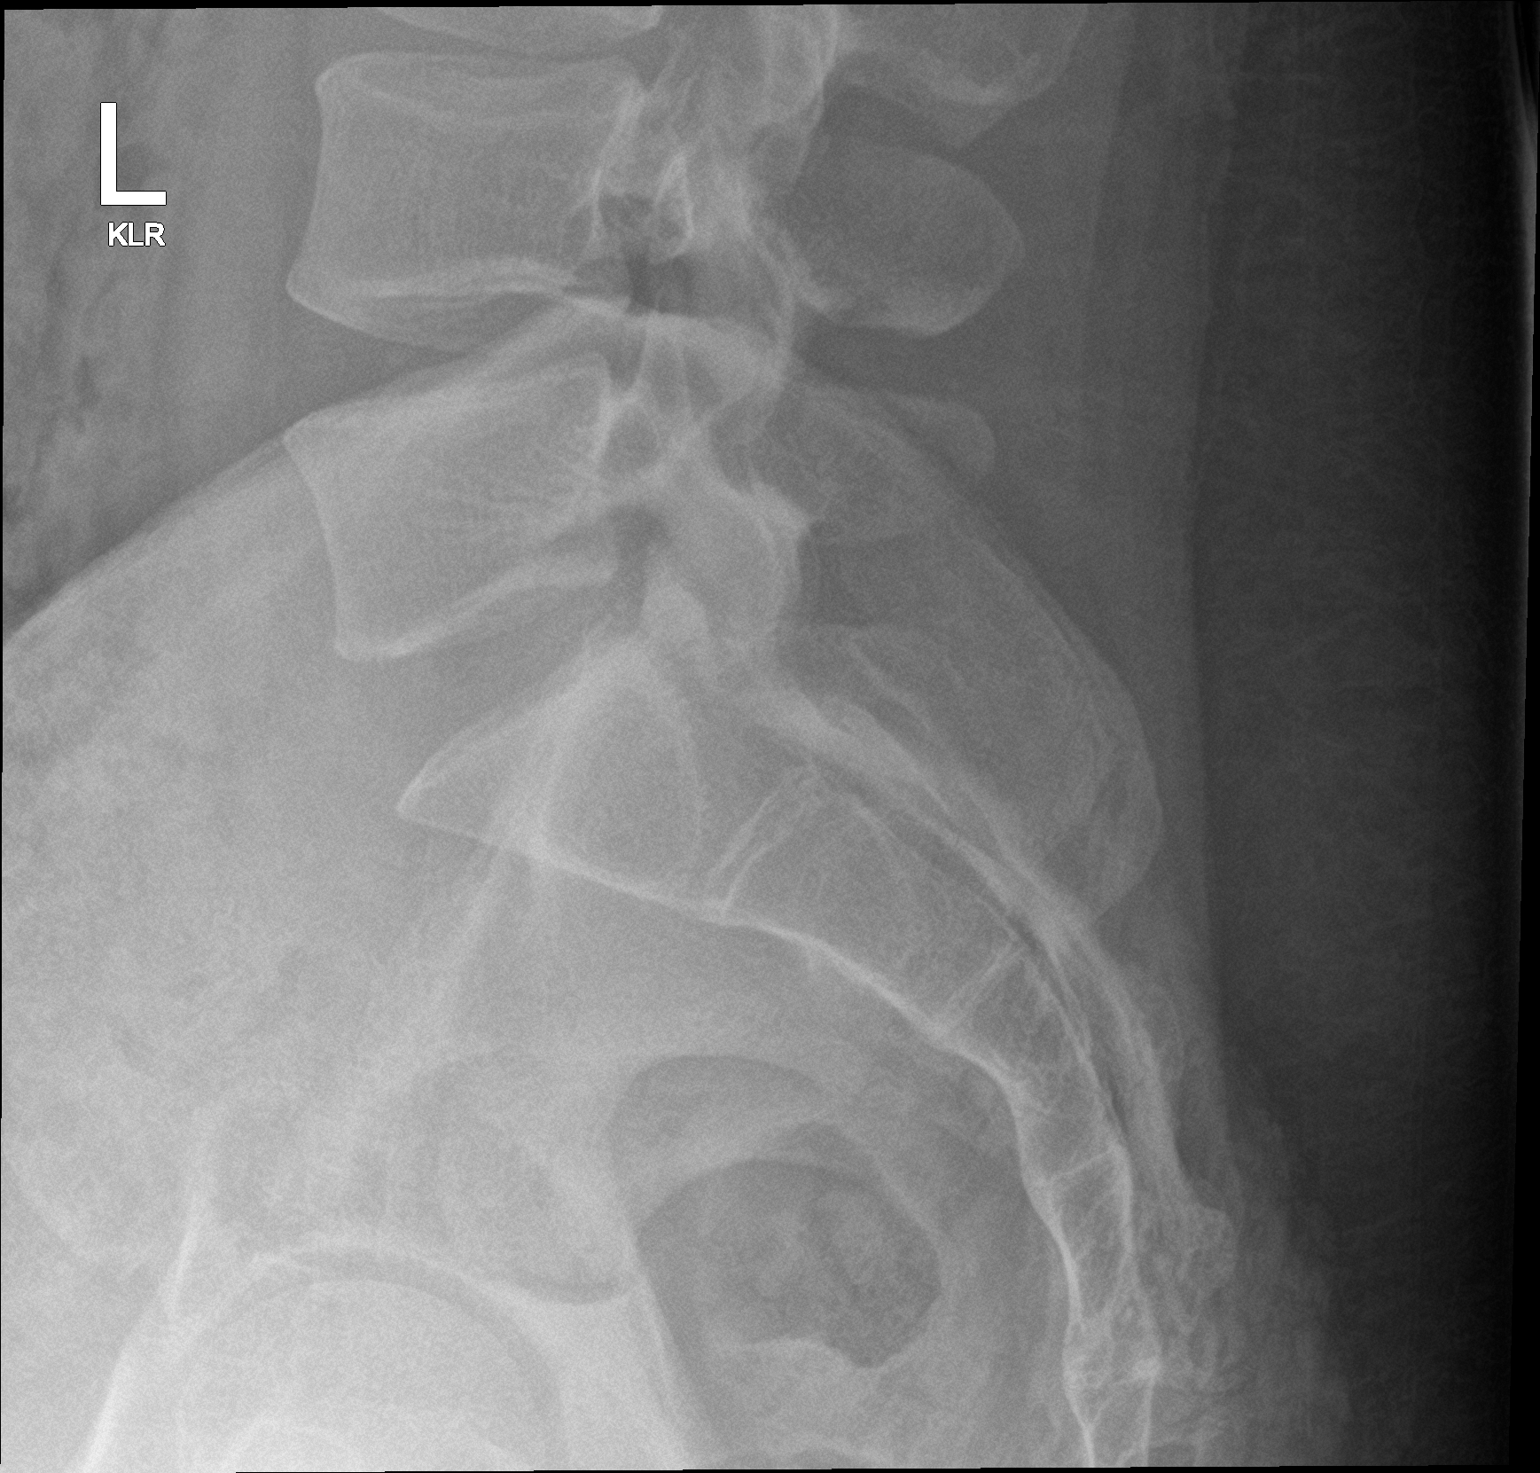

[3 of 3 positions shown; findings below may reference images not displayed]

FINDINGS: Lumbar Spine:

Lumbar vertebral elements maintain normal alignment without evidence
of anterolisthesis, retrolisthesis, subluxation.

No acute fracture line identified.

Lumbar vertebral body heights maintained. Mild wedging of the T12
level, likely chronic.

Disc space heights maintained with no significant degenerative disc
disease or endplate changes.

No significant facet changes.

Unremarkable appearance of the visualized abdomen.
IMPRESSION: Negative for acute fracture or malalignment of the lumbar spine.

## 2020-07-22 ENCOUNTER — Ambulatory Visit: Payer: Commercial Managed Care - PPO | Admitting: Hospice and Palliative Medicine

## 2020-11-08 ENCOUNTER — Other Ambulatory Visit: Payer: Self-pay

## 2020-11-08 ENCOUNTER — Emergency Department
Admission: EM | Admit: 2020-11-08 | Discharge: 2020-11-08 | Disposition: A | Discharge status: Home / Self Care | Payer: Commercial Managed Care - PPO | Attending: Emergency Medicine | Admitting: Emergency Medicine

## 2020-11-08 DIAGNOSIS — F17220 Nicotine dependence, chewing tobacco, uncomplicated: Secondary | ICD-10-CM | POA: Insufficient documentation

## 2020-11-08 DIAGNOSIS — K644 Residual hemorrhoidal skin tags: Secondary | ICD-10-CM | POA: Insufficient documentation

## 2020-11-08 DIAGNOSIS — K625 Hemorrhage of anus and rectum: Secondary | ICD-10-CM | POA: Diagnosis present

## 2020-11-08 LAB — CBC
HCT: 44.3 % (ref 39.0–52.0)
Hemoglobin: 14.7 g/dL (ref 13.0–17.0)
MCH: 29.9 pg (ref 26.0–34.0)
MCHC: 33.2 g/dL (ref 30.0–36.0)
MCV: 90 fL (ref 80.0–100.0)
Platelets: 228 10*3/uL (ref 150–400)
RBC: 4.92 MIL/uL (ref 4.22–5.81)
RDW: 13.4 % (ref 11.5–15.5)
WBC: 6.7 10*3/uL (ref 4.0–10.5)
nRBC: 0 % (ref 0.0–0.2)

## 2020-11-08 MED ORDER — WITCH HAZEL-GLYCERIN EX PADS: 1.0000 "application " | MEDICATED_PAD | CUTANEOUS | 12 refills | Status: AC | PRN

## 2020-11-08 MED ORDER — HYDROCORTISONE ACETATE 25 MG RE SUPP: 25.0000 mg | Freq: Two times a day (BID) | RECTAL | 0 refills | Status: AC

## 2020-11-08 MED ORDER — SENNOSIDES-DOCUSATE SODIUM 8.6-50 MG PO TABS: 2.0000 | ORAL_TABLET | Freq: Two times a day (BID) | ORAL | 0 refills | Status: AC

## 2020-11-08 NOTE — ED Triage Notes (Addendum)
Pt states is having dark red blood in stools. Pt states he thinks it is his hemmorhoids. Pt states was having rectal pain. Denies abd pain. States began a few days ago, pt states he did have rectal pain, but "I think something busted and it's gone now".

## 2020-11-08 NOTE — ED Notes (Addendum)
FIRST NURSE NOTE: Ambulatory to desk. Pt alert and oriented X4, cooperative, RR even and unlabored, color WNL. Pt in NAD.

## 2020-11-08 NOTE — ED Provider Notes (Signed)
[facility] Emergency Department Provider Note  ____________________________________________  Time seen: Approximately 11:37 PM  I have reviewed the triage vital signs and the nursing notes.   HISTORY  Chief Complaint Rectal Bleeding    HPI Dalton Werner is a 35 y.o. male with no significant past medical history who comes the ED complaining of rectal bleeding for the past 10 days, off and on.  Occurs with bowel movements.  Bowel movements are sometimes painful, he does note that he sometimes feels constipated and has to strain but tries to avoid it.  He had noticed some rectal pain and swelling and felt that he had a hemorrhoid, and then 1 day it seemed to rupture and cause the bleeding to start.  Does not take any blood thinners or other medications.  No trauma to the area.  Bleeding is intermittent.  No abdominal pain or vomiting.  No fever.      No past medical history on file.   There are no problems to display for this patient.    No past surgical history on file.   Prior to Admission medications   Medication Sig Start Date End Date Taking? Authorizing Provider  hydrocortisone (ANUSOL-HC) 25 MG suppository Place 1 suppository (25 mg total) rectally every 12 (twelve) hours for 7 days. 11/08/20 11/15/20 Yes Sharman Cheek, MD  senna-docusate (SENOKOT-S) 8.6-50 MG tablet Take 2 tablets by mouth 2 (two) times daily. 11/08/20  Yes Sharman Cheek, MD  witch hazel-glycerin (TUCKS) pad Apply 1 application topically as needed for itching. 11/08/20  Yes Sharman Cheek, MD  naproxen (NAPROSYN) 500 MG tablet TAKE 1 TABLET (500 MG TOTAL) BY MOUTH 2 (TWO) TIMES DAILY WITH A MEAL. 03/26/19   Scarboro, Coralee North, NP  traMADol (ULTRAM) 50 MG tablet Take 1 tablet (50 mg total) by mouth every 6 (six) hours as needed. 02/18/19   Joni Reining, PA-C     Allergies Patient has no known allergies.   Family History  Problem Relation Age of Onset  . Hypertension  Mother   . Liver cancer Father     Social History Social History   Tobacco Use  . Smoking status: Never Smoker  . Smokeless tobacco: Current User    Types: Chew  Vaping Use  . Vaping Use: Never used  Substance Use Topics  . Alcohol use: Yes  . Drug use: No    Review of Systems  Constitutional:   No fever or chills.  ENT:   No sore throat. No rhinorrhea. Cardiovascular:   No chest pain or syncope. Respiratory:   No dyspnea or cough. Gastrointestinal:   Negative for abdominal pain, vomiting and diarrhea.  Positive rectal bleeding as above Musculoskeletal:   Negative for focal pain or swelling All other systems reviewed and are negative except as documented above in ROS and HPI.  ____________________________________________   PHYSICAL EXAM:  VITAL SIGNS: ED Triage Vitals [11/08/20 2205]  Enc Vitals Group     BP 125/85     Pulse Rate 93     Resp 16     Temp 97.8 F (36.6 C)     Temp Source Oral     SpO2 100 %     Weight 275 lb (124.7 kg)     Height 5\' 11"  (1.803 m)     Head Circumference      Peak Flow      Pain Score 0     Pain Loc      Pain Edu?  Excl. in GC?     Vital signs reviewed, nursing assessments reviewed.   Constitutional:   Alert and oriented. Non-toxic appearance. Eyes:   Conjunctivae are normal. EOMI.  ENT      Head:   Normocephalic and atraumatic.      Nose:   Wearing a mask.      Mouth/Throat:   Wearing a mask.      Neck:   No meningismus. Full ROM.  Cardiovascular:   RRR. Cap refill less than 2 seconds. Respiratory:   Normal respiratory effort without tachypnea/retractions. Breath sounds are clear and equal bilaterally. No wheezes/rales/rhonchi. Gastrointestinal:   Soft and nontender. Non distended.   No rebound, rigidity, or guarding.  Rectal exam shows ruptured external hemorrhoid with signs of recent bleeding.  Currently hemostatic.  No melena, there is some light fresh blood at the anus.  Musculoskeletal:   Normal range of  motion in all extremities. No joint effusions.  No lower extremity tenderness.  No edema. Neurologic:   Normal speech and language.  Motor grossly intact. No acute focal neurologic deficits are appreciated.  Skin:    Skin is warm, dry and intact. No rash noted.  No petechiae, purpura, or bullae.  ____________________________________________    LABS (pertinent positives/negatives) (all labs ordered are listed, but only abnormal results are displayed) Labs Reviewed  CBC   ____________________________________________   EKG    ____________________________________________    RADIOLOGY  No results found.  ____________________________________________   PROCEDURES Procedures  ____________________________________________    CLINICAL IMPRESSION / ASSESSMENT AND PLAN / ED COURSE  Medications ordered in the ED: Medications - No data to display  Pertinent labs & imaging results that were available during my care of the patient were reviewed by me and considered in my medical decision making (see chart for details).  Dalton Werner was evaluated in Emergency Department on 11/08/2020 for the symptoms described in the history of present illness. He was evaluated in the context of the global COVID-19 pandemic, which necessitated consideration that the patient might be at risk for infection with the SARS-CoV-2 virus that causes COVID-19. Institutional protocols and algorithms that pertain to the evaluation of patients at risk for COVID-19 are in a state of rapid change based on information released by regulatory bodies including the CDC and federal and state organizations. These policies and algorithms were followed during the patient's care in the ED.   Patient presents with rectal bleeding due to ruptured hemorrhoid, currently hemostatic.  Vital signs are normal, hemoglobin is normal after more than a week of symptoms.  He is nontoxic and ambulatory.  Suitable for outpatient management,  surgery clinic follow-up as needed.      ____________________________________________   FINAL CLINICAL IMPRESSION(S) / ED DIAGNOSES    Final diagnoses:  External bleeding hemorrhoids     ED Discharge Orders         Ordered    hydrocortisone (ANUSOL-HC) 25 MG suppository  Every 12 hours        11/08/20 2336    witch hazel-glycerin (TUCKS) pad  As needed        11/08/20 2336    senna-docusate (SENOKOT-S) 8.6-50 MG tablet  2 times daily        11/08/20 2336          Portions of this note were generated with dragon dictation software. Dictation errors may occur despite best attempts at proofreading.   Sharman Cheek, MD 11/08/20 2340

## 2021-01-14 ENCOUNTER — Ambulatory Visit: Payer: Commercial Managed Care - PPO | Admitting: Physician Assistant

## 2021-01-14 ENCOUNTER — Other Ambulatory Visit: Payer: Self-pay

## 2021-01-14 ENCOUNTER — Encounter: Payer: Self-pay | Admitting: Physician Assistant

## 2021-01-14 DIAGNOSIS — R0981 Nasal congestion: Secondary | ICD-10-CM

## 2021-01-14 DIAGNOSIS — R6889 Other general symptoms and signs: Secondary | ICD-10-CM

## 2021-01-14 DIAGNOSIS — R058 Other specified cough: Secondary | ICD-10-CM

## 2021-01-14 LAB — POC SOFIA 2 FLU + SARS ANTIGEN FIA
Influenza A, POC: NEGATIVE
Influenza B, POC: NEGATIVE
SARS Coronavirus 2 Ag: NEGATIVE

## 2021-01-14 LAB — POCT INFLUENZA A/B
Influenza A, POC: NEGATIVE
Influenza B, POC: NEGATIVE

## 2021-01-14 NOTE — Progress Notes (Signed)
[facility] [address] [place], [place] [address]  Internal MEDICINE  Office Visit Note  Patient Name: Dalton Werner  [id number]  [id number]  Date of Service: 01/14/2021  Chief Complaint  Patient presents with  . Acute Visit    Covid and flu test neg, coughing body aches, sneezing, low grade fever, light headed, headache for about 2 days       HPI Pt is here for a sick visit. -Covid and flu test are negative in office today -Pt has been experiencing coughing, body aches, sneezing, a low grade fever and some headaches for the last 2 days. Every time he coughs his head will start pounding and has a scratchy throat. Has been fatigued. Slept for 13 hours last night. -Starting to feel better this morning -Has not taken anything at home.  -Didn't go to work the past two nights and works third shift at [employer].  Thinks things are improving with increased rest on taking days off work.  Plans to go back on Friday to give it 1 more day of recovery. -Patient is overdue for routine physical with lab work and we will schedule that today  Current Medication:  Outpatient Encounter Medications as of 01/14/2021  Medication Sig  . naproxen (NAPROSYN) 500 MG tablet TAKE 1 TABLET (500 MG TOTAL) BY MOUTH 2 (TWO) TIMES DAILY WITH A MEAL.  Marland Kitchen senna-docusate (SENOKOT-S) 8.6-50 MG tablet Take 2 tablets by mouth 2 (two) times daily.  . traMADol (ULTRAM) 50 MG tablet Take 1 tablet (50 mg total) by mouth every 6 (six) hours as needed.  Marland Kitchen witch hazel-glycerin (TUCKS) pad Apply 1 application topically as needed for itching.   No facility-administered encounter medications on file as of 01/14/2021.      Medical History: History reviewed. No pertinent past medical history.   Vital Signs: BP 108/80   Pulse 76   Temp 98.8 F (37.1 C)   Resp 16   Ht 5\' 11"  (1.803 m)   Wt 280 lb 9.6 oz (127.3 kg)   SpO2 97%   BMI 39.14 kg/m    Review of Systems  Constitutional: Positive for fatigue  and fever.  HENT: Positive for congestion, postnasal drip, sinus pressure, sinus pain, sneezing and sore throat. Negative for mouth sores.   Respiratory: Positive for cough. Negative for shortness of breath and wheezing.   Cardiovascular: Negative for chest pain.  Genitourinary: Negative for flank pain.  Musculoskeletal: Positive for arthralgias.  Neurological: Positive for headaches.  Psychiatric/Behavioral: Negative.     Physical Exam Vitals and nursing note reviewed.  Constitutional:      General: He is not in acute distress.    Appearance: He is well-developed. He is obese. He is not diaphoretic.  HENT:     Head: Normocephalic and atraumatic.     Nose: Congestion present.     Mouth/Throat:     Pharynx: No oropharyngeal exudate.  Eyes:     Pupils: Pupils are equal, round, and reactive to light.  Neck:     Thyroid: No thyromegaly.     Vascular: No JVD.     Trachea: No tracheal deviation.  Cardiovascular:     Rate and Rhythm: Normal rate and regular rhythm.     Heart sounds: Normal heart sounds. No murmur heard. No friction rub. No gallop.   Pulmonary:     Effort: Pulmonary effort is normal. No respiratory distress.     Breath sounds: No wheezing or rales.  Chest:     Chest  wall: No tenderness.  Abdominal:     General: Bowel sounds are normal.     Palpations: Abdomen is soft.  Musculoskeletal:        General: Normal range of motion.     Cervical back: Normal range of motion and neck supple.  Lymphadenopathy:     Cervical: No cervical adenopathy.  Skin:    General: Skin is warm and dry.  Neurological:     Mental Status: He is alert and oriented to person, place, and time.     Cranial Nerves: No cranial nerve deficit.  Psychiatric:        Behavior: Behavior normal.        Thought Content: Thought content normal.        Judgment: Judgment normal.       Assessment/Plan: 1. Nasal sinus congestion Advised to take Mucinex and Flonase as well as an antihistamine  such as Allegra or Zyrtec to help with acute congestion and allergies.  Also advised to use Tylenol as needed for headaches and body aches.  Patient given work note to go back to work on Friday.  Encouraged to stay well-hydrated and to drink warm tea with honey or soup to help with scratchy throat.  2. Cough with exposure to COVID-19 virus - POC SOFIA 2 FLU + SARS ANTIGEN FIA negative  3. Flu-like symptoms - POCT Influenza A/B negative   General Counseling: Peggy verbalizes understanding of the findings of todays visit and agrees with plan of treatment. I have discussed any further diagnostic evaluation that may be needed or ordered today. We also reviewed his medications today. he has been encouraged to call the office with any questions or concerns that should arise related to todays visit.    Counseling:    Orders Placed This Encounter  Procedures  . POCT Influenza A/B  . POC SOFIA 2 FLU + SARS ANTIGEN FIA    No orders of the defined types were placed in this encounter.   Time spent:30 Minutes

## 2021-03-11 ENCOUNTER — Encounter: Payer: Commercial Managed Care - PPO | Admitting: Internal Medicine

## 2021-03-15 ENCOUNTER — Telehealth: Payer: Self-pay

## 2021-03-15 NOTE — Telephone Encounter (Signed)
Left vm to screen for 03/16/21 appointment-Toni 

## 2021-03-16 ENCOUNTER — Encounter: Payer: Commercial Managed Care - PPO | Admitting: Nurse Practitioner

## 2021-03-18 ENCOUNTER — Encounter: Payer: Commercial Managed Care - PPO | Admitting: Physician Assistant

## 2021-09-02 ENCOUNTER — Ambulatory Visit: Admit: 2021-09-02 | Discharge status: Against Medical Advice | Payer: Commercial Managed Care - PPO

## 2022-03-20 ENCOUNTER — Other Ambulatory Visit: Payer: Self-pay

## 2022-03-20 DIAGNOSIS — Y9389 Activity, other specified: Secondary | ICD-10-CM | POA: Insufficient documentation

## 2022-03-20 DIAGNOSIS — W010XXA Fall on same level from slipping, tripping and stumbling without subsequent striking against object, initial encounter: Secondary | ICD-10-CM | POA: Insufficient documentation

## 2022-03-20 DIAGNOSIS — S39012A Strain of muscle, fascia and tendon of lower back, initial encounter: Secondary | ICD-10-CM | POA: Diagnosis not present

## 2022-03-20 DIAGNOSIS — S3992XA Unspecified injury of lower back, initial encounter: Secondary | ICD-10-CM | POA: Diagnosis present

## 2022-03-20 NOTE — ED Triage Notes (Signed)
Patient reports back problems in past (never seen for) tonight playing with kids felt it tensing up then felt a pop.  Reports lower back pain with difficulty moving.

## 2022-03-21 ENCOUNTER — Emergency Department
Admission: EM | Admit: 2022-03-21 | Discharge: 2022-03-21 | Disposition: A | Discharge status: Home / Self Care | Payer: BC Managed Care – PPO | Attending: Emergency Medicine | Admitting: Emergency Medicine

## 2022-03-21 DIAGNOSIS — S39012A Strain of muscle, fascia and tendon of lower back, initial encounter: Secondary | ICD-10-CM

## 2022-03-21 DIAGNOSIS — M545 Low back pain, unspecified: Secondary | ICD-10-CM

## 2022-03-21 MED ORDER — IBUPROFEN 800 MG PO TABS: 800.0000 mg | ORAL_TABLET | Freq: Three times a day (TID) | ORAL | 0 refills | Status: AC | PRN

## 2022-03-21 MED ORDER — OXYCODONE-ACETAMINOPHEN 5-325 MG PO TABS
1.0000 | ORAL_TABLET | Freq: Once | ORAL | Status: AC
  Administered 2022-03-21: 1 via ORAL
  Filled 2022-03-21: qty 1

## 2022-03-21 MED ORDER — CYCLOBENZAPRINE HCL 10 MG PO TABS
5.0000 mg | ORAL_TABLET | Freq: Once | ORAL | Status: AC
Start: 2022-03-21 — End: 2022-03-21
  Administered 2022-03-21: 5 mg via ORAL
  Filled 2022-03-21: qty 1

## 2022-03-21 MED ORDER — CYCLOBENZAPRINE HCL 5 MG PO TABS: ORAL_TABLET | ORAL | 0 refills | Status: DC

## 2022-03-21 MED ORDER — KETOROLAC TROMETHAMINE 60 MG/2ML IM SOLN
30.0000 mg | Freq: Once | INTRAMUSCULAR | Status: AC
  Administered 2022-03-21: 30 mg via INTRAMUSCULAR
  Filled 2022-03-21: qty 2

## 2022-03-21 MED ORDER — OXYCODONE-ACETAMINOPHEN 5-325 MG PO TABS: 1.0000 | ORAL_TABLET | ORAL | 0 refills | Status: DC | PRN

## 2022-03-21 NOTE — ED Provider Notes (Signed)
[facility] Provider Note    Event Date/Time   First MD Initiated Contact with Patient 03/21/22 [phone]     (approximate)   History   Back Pain   HPI  Dalton Werner is a 36 y.o. male who presents to the ED from home with a chief complaint of lower back pain.  Patient was playing with his kids yesterday afternoon when he knocked a ball behind him, lost his balance and fell onto his hands and knees.  Felt his lower back tense and felt a pop.  Reports muscle spasms which causes difficulty moving.  Denies striking head or LOC.  Denies extremity weakness/numbness/tingling.  Denies bowel or bladder incontinence.  Voices no other complaints or injuries.     Past Medical History  No past medical history on file.   Active Problem List  There are no problems to display for this patient.    Past Surgical History  No past surgical history on file.   Home Medications   Prior to Admission medications   Medication Sig Start Date End Date Taking? Authorizing Provider  naproxen (NAPROSYN) 500 MG tablet TAKE 1 TABLET (500 MG TOTAL) BY MOUTH 2 (TWO) TIMES DAILY WITH A MEAL. 03/26/19   Scarboro, Coralee North, NP  senna-docusate (SENOKOT-S) 8.6-50 MG tablet Take 2 tablets by mouth 2 (two) times daily. 11/08/20   Sharman Cheek, MD  traMADol (ULTRAM) 50 MG tablet Take 1 tablet (50 mg total) by mouth every 6 (six) hours as needed. 02/18/19   Joni Reining, PA-C  witch hazel-glycerin (TUCKS) pad Apply 1 application topically as needed for itching. 11/08/20   Sharman Cheek, MD     Allergies  Patient has no known allergies.   Family History   Family History  Problem Relation Age of Onset   Hypertension Mother    Liver cancer Father      Physical Exam  Triage Vital Signs: ED Triage Vitals  Enc Vitals Group     BP 03/20/22 2348 107/76     Pulse Rate 03/20/22 2348 87     Resp 03/20/22 2348 17     Temp 03/20/22 2348 98.4 F (36.9 C)     Temp Source 03/20/22 2348  Oral     SpO2 03/20/22 2348 97 %     Weight 03/20/22 2354 280 lb (127 kg)     Height 03/20/22 2354 5\' 11"  (1.803 m)     Head Circumference --      Peak Flow --      Pain Score 03/20/22 2354 10     Pain Loc --      Pain Edu? --      Excl. in GC? --     Updated Vital Signs: BP 115/77 (BP Location: Left Arm)   Pulse 77   Temp 98.4 F (36.9 C) (Oral)   Resp 16   Ht 5\' 11"  (1.803 m)   Wt 127 kg   SpO2 100%   BMI 39.05 kg/m    General: Awake, mild distress.  CV:  RRR.  Good peripheral perfusion.  Resp:  Normal effort.  CTA B. Abd:  Nontender.  No distention.  Other:  No spinal tenderness to palpation.  Pelvis is stable.  Bilateral lumbar paraspinal muscle spasms. +SLR bilaterally.   ED Results / Procedures / Treatments  Labs (all labs ordered are listed, but only abnormal results are displayed) Labs Reviewed - No data to display   EKG  None   RADIOLOGY None  Official radiology report(s): No results found.   PROCEDURES:  Critical Care performed: No  Procedures   MEDICATIONS ORDERED IN ED: Medications  ketorolac (TORADOL) injection 30 mg (has no administration in time range)  oxyCODONE-acetaminophen (PERCOCET/ROXICET) 5-325 MG per tablet 1 tablet (has no administration in time range)  cyclobenzaprine (FLEXERIL) tablet 5 mg (has no administration in time range)     IMPRESSION / MDM / ASSESSMENT AND PLAN / ED COURSE  I reviewed the triage vital signs and the nursing notes.                             36 year old male presenting with low back strain.  No focal neurological deficits on examination.  Will administer IM ketorolac, oral Percocet and Flexeril.  Strict return precautions given.  Patient and spouse verbalized understanding agree with plan of care  Patient's presentation is most consistent with acute, uncomplicated illness.   FINAL CLINICAL IMPRESSION(S) / ED DIAGNOSES   Final diagnoses:  Strain of lumbar region, initial encounter  Acute  bilateral low back pain without sciatica     Rx / DC Orders   ED Discharge Orders     None        Note:  This document was prepared using Dragon voice recognition software and may include unintentional dictation errors.   Irean Hong, MD 03/21/22 [phone]

## 2022-03-21 NOTE — Discharge Instructions (Signed)
You may take medicines as needed for pain and muscle spasms (Motrin/Flexeril/Percocet #15).  Apply moist heat to affected area several times daily.  Return to the ER for worsening symptoms, persistent vomiting, difficulty breathing or other concerns.

## 2023-02-19 ENCOUNTER — Encounter: Payer: Self-pay | Admitting: Emergency Medicine

## 2023-02-19 DIAGNOSIS — M79675 Pain in left toe(s): Secondary | ICD-10-CM | POA: Diagnosis present

## 2023-02-19 NOTE — ED Triage Notes (Signed)
Pt c/o left great toe pain and swelling x2 days after playing outside with his nephews. Pt denies injury. Area is swollen with redness.

## 2023-02-20 ENCOUNTER — Emergency Department: Payer: BC Managed Care – PPO

## 2023-02-20 ENCOUNTER — Emergency Department
Admission: EM | Admit: 2023-02-20 | Discharge: 2023-02-20 | Disposition: A | Discharge status: Home / Self Care | Payer: BC Managed Care – PPO | Attending: Emergency Medicine | Admitting: Emergency Medicine

## 2023-02-20 DIAGNOSIS — M79675 Pain in left toe(s): Secondary | ICD-10-CM

## 2023-02-20 NOTE — ED Provider Notes (Signed)
[facility] Provider Note    Event Date/Time   First MD Initiated Contact with Patient 02/20/23 0402     (approximate)   History   Toe Pain   HPI Dalton Werner is a 37 y.o. male who presents for evaluation of pain and some swelling to his left great toe after playing outside with his nephews.  He said that it felt fine at the time but his feet were kind of crammed into some boots.  They are playing football and wrestling around and then afterwards he felt some pain and swelling in the left toe.  He is able to ambulate but hurts a little bit to do so and he wanted to get it checked out to make sure it was okay.  He has no history of gout.  He has no other injuries     Physical Exam   Triage Vital Signs: ED Triage Vitals  Enc Vitals Group     BP 02/19/23 2339 (!) 132/90     Pulse Rate 02/19/23 2339 90     Resp 02/19/23 2339 16     Temp 02/19/23 2339 98 F (36.7 C)     Temp Source 02/19/23 2339 Oral     SpO2 02/19/23 2339 97 %     Weight 02/19/23 2340 127 kg (280 lb)     Height 02/19/23 2340 1.803 m (5\' 11" )     Head Circumference --      Peak Flow --      Pain Score 02/19/23 2348 6     Pain Loc --      Pain Edu? --      Excl. in GC? --     Most recent vital signs: Vitals:   02/19/23 2339 02/20/23 0347  BP: (!) 132/90 126/82  Pulse: 90 67  Resp: 16 15  Temp: 98 F (36.7 C) 98.1 F (36.7 C)  SpO2: 97% 95%    General: Awake, no distress.  CV:  Good peripheral perfusion.  Resp:  Normal effort. Speaking easily and comfortably, no accessory muscle usage nor intercostal retractions.   Abd:  No distention.  Other:  Essentially normal-appearing the left great toe when compared to the right great toe, although it is possibly a little bit bigger.  It is not erythematous and there is no tenderness to palpation or swelling to the MTP.  No nail involvement, no rash.  Some pain with manipulation, range of motion, etc. but no specific tenderness to  palpation or evidence of infection.   ED Results / Procedures / Treatments   Labs (all labs ordered are listed, but only abnormal results are displayed) Labs Reviewed - No data to display   RADIOLOGY I viewed and interpreted the patient's great toe x-rays and I see no evidence of bony abnormality or dislocation.   PROCEDURES:  Critical Care performed: No  Procedures    IMPRESSION / MDM / ASSESSMENT AND PLAN / ED COURSE  I reviewed the triage vital signs and the nursing notes.                              Differential diagnosis includes, but is not limited to, sprain, strain, fracture, dislocation, infection, gout/podagra  Patient's presentation is most consistent with acute, uncomplicated illness.  Labs/studies ordered: Great toe x-ray  Interventions/Medications given:  Medications - No data to display Hard soled shoe (Note:  hospital course my include additional interventions and/or  labs/studies not listed above.)   Initially thought the patient might be suffering from his first gout attack, but on physical exam I think that is unlikely.  I believe he most likely just sprained his toe due to wearing boots or tight shoes while playing football and wrestling with his nephews.  I provided a hard soled shoe and recommend he follow-up with podiatry and use ibuprofen and Tylenol as needed.  Patient says he understands and agrees with the plan.         FINAL CLINICAL IMPRESSION(S) / ED DIAGNOSES   Final diagnoses:  Great toe pain, left     Rx / DC Orders   ED Discharge Orders     None        Note:  This document was prepared using Dragon voice recognition software and may include unintentional dictation errors.   Loleta Rose, MD 02/20/23 1335

## 2023-02-20 NOTE — Discharge Instructions (Addendum)
Please use the shoe that was provided and allow what we believe is a sprain in your toe to heal.  Use over-the-counter ibuprofen and/or Tylenol as needed, we recommend ibuprofen 600 mg 3 times a day with meals and Tylenol according to the label instructions on the bottle.  We recommend that you call the office of Dr. Alberteen Spindle and schedule a follow-up appointment with him or one of his colleagues in the podiatry clinic.

## 2023-02-21 ENCOUNTER — Ambulatory Visit
Admission: RE | Admit: 2023-02-21 | Discharge: 2023-02-21 | Disposition: A | Discharge status: Home / Self Care | Payer: BC Managed Care – PPO | Source: Ambulatory Visit | Attending: Physician Assistant | Admitting: Physician Assistant

## 2023-02-21 VITALS — BP 137/83 | HR 65 | Temp 97.9°F | Resp 16 | Ht 71.0 in | Wt 280.0 lb

## 2023-02-21 DIAGNOSIS — M79672 Pain in left foot: Secondary | ICD-10-CM | POA: Diagnosis not present

## 2023-02-21 DIAGNOSIS — S93602A Unspecified sprain of left foot, initial encounter: Secondary | ICD-10-CM | POA: Diagnosis not present

## 2023-02-21 MED ORDER — NAPROXEN 500 MG PO TABS: 500.0000 mg | ORAL_TABLET | Freq: Two times a day (BID) | ORAL | 0 refills | Status: AC

## 2023-02-21 NOTE — ED Triage Notes (Signed)
Pt c/o L big toe pain x3 days. States he was playing outside with his nephews. Denies any injuries. Was seen at [facility] yesterday & had x-ray done which was unremarkable.

## 2023-02-21 NOTE — ED Provider Notes (Signed)
[facility]    CSN: [id number] Arrival date & time: 02/21/23  0904      History   Chief Complaint Chief Complaint  Patient presents with   Toe Pain    Appt    HPI Dalton Werner is a 37 y.o. male presenting for 3 day history of left great toe pain/swelling. Symptoms started after injury playing football with his family. States the stepped backwards onto the foot.  About 15 minutes after playing outside he developed the pain.  He states he then went back to work the next day.  States he has to cram his feet into heavy steel toe boots which are tight.  He reports that he went to the emergency department late on the night of 02/19/2023.  States he had an x-ray performed of his toe at that time and it was negative for any fractures.  He does not have any personal history of gout or arthritis in his foot.  He says his pain has gotten a little better since then but he has not been back to work because he knows when he puts his feet back into the boots it can cause more pain.  He says he has been trying to soak his foot but has not really taken anything for pain relief.  Patient does not think he can go back to work for couple more days and would actually like to stay out of work until next Wednesday.  HPI  History reviewed. No pertinent past medical history.  There are no problems to display for this patient.   History reviewed. No pertinent surgical history.     Home Medications    Prior to Admission medications   Medication Sig Start Date End Date Taking? Authorizing Provider  naproxen (NAPROSYN) 500 MG tablet Take 1 tablet (500 mg total) by mouth 2 (two) times daily. 02/21/23  Yes Eusebio Friendly B, PA-C  ibuprofen (ADVIL) 800 MG tablet Take 1 tablet (800 mg total) by mouth every 8 (eight) hours as needed for moderate pain. 03/21/22   Irean Hong, MD  senna-docusate (SENOKOT-S) 8.6-50 MG tablet Take 2 tablets by mouth 2 (two) times daily. 11/08/20   Sharman Cheek, MD   witch hazel-glycerin (TUCKS) pad Apply 1 application topically as needed for itching. 11/08/20   Sharman Cheek, MD    Family History Family History  Problem Relation Age of Onset   Hypertension Mother    Liver cancer Father     Social History Social History   Tobacco Use   Smoking status: Never   Smokeless tobacco: Current    Types: Chew  Vaping Use   Vaping Use: Never used  Substance Use Topics   Alcohol use: Yes    Comment: 3 per month   Drug use: No     Allergies   Patient has no known allergies.   Review of Systems Review of Systems  Musculoskeletal:  Positive for arthralgias and joint swelling. Negative for gait problem.  Skin:  Negative for color change and wound.  Neurological:  Negative for weakness and numbness.     Physical Exam Triage Vital Signs ED Triage Vitals  Enc Vitals Group     BP 02/21/23 0909 137/83     Pulse Rate 02/21/23 0909 65     Resp 02/21/23 0909 16     Temp 02/21/23 0909 97.9 F (36.6 C)     Temp Source 02/21/23 0909 Oral     SpO2 02/21/23 0909 96 %  Weight 02/21/23 0907 280 lb (127 kg)     Height 02/21/23 0907 5\' 11"  (1.803 m)     Head Circumference --      Peak Flow --      Pain Score 02/21/23 0911 0     Pain Loc --      Pain Edu? --      Excl. in GC? --    No data found.  Updated Vital Signs BP 137/83 (BP Location: Right Arm)   Pulse 65   Temp 97.9 F (36.6 C) (Oral)   Resp 16   Ht 5\' 11"  (1.803 m)   Wt 280 lb (127 kg)   SpO2 96%   BMI 39.05 kg/m   Physical Exam Vitals and nursing note reviewed.  Constitutional:      General: He is not in acute distress.    Appearance: Normal appearance. He is well-developed. He is not ill-appearing.  HENT:     Head: Normocephalic and atraumatic.  Eyes:     General: No scleral icterus.    Conjunctiva/sclera: Conjunctivae normal.  Cardiovascular:     Rate and Rhythm: Normal rate and regular rhythm.  Pulmonary:     Effort: Pulmonary effort is normal. No  respiratory distress.  Musculoskeletal:     Cervical back: Neck supple.     Comments: Left foot: There is mild tenderness along the dorsal medial foot and slight tenderness of the MTP without any associated swelling, erythema, warmth.  Full range of motion of the foot and toe.  Good pulses and strength.  No wounds, abrasions, contusions  Skin:    General: Skin is warm and dry.     Capillary Refill: Capillary refill takes less than 2 seconds.  Neurological:     General: No focal deficit present.     Mental Status: He is alert.     Motor: No weakness.     Gait: Gait normal.  Psychiatric:        Mood and Affect: Mood normal.        Behavior: Behavior normal.      UC Treatments / Results  Labs (all labs ordered are listed, but only abnormal results are displayed) Labs Reviewed - No data to display  EKG   Radiology DG Toe Great Left  Result Date: 02/20/2023 CLINICAL DATA:  Pain, swelling EXAM: LEFT GREAT TOE COMPARISON:  None Available. FINDINGS: There is no evidence of fracture or dislocation. There is no evidence of arthropathy or other focal bone abnormality. Soft tissues are unremarkable. IMPRESSION: Negative. Electronically Signed   By: Charlett Nose M.D.   On: 02/20/2023 00:24    Procedures Procedures (including critical care time)  Medications Ordered in UC Medications - No data to display  Initial Impression / Assessment and Plan / UC Course  I have reviewed the triage vital signs and the nursing notes.  Pertinent labs & imaging results that were available during my care of the patient were reviewed by me and considered in my medical decision making (see chart for details).   37 year old male presents for left foot pain and swelling following an injury that occurred a couple days ago.  Went to the emergency department after it occurred and had a normal x-ray of his foot.  He is back today because he has been out of work and would like to continue to be out of work for the  next couple days to let his injury recover.  States he has to wear steel toe boots and  it causes increased pain.  On exam there is no obvious swelling or deformity.  No erythema, contusions, wounds.  He does have some tenderness of the first MTP joint but no associated swelling, erythema.  Full range of motion.  Suspect possible strain or sprain of his foot.  Supportive care advised.  Reviewed RICE guidelines.  Sent naproxen to pharmacy.  Work note given for 3 days.  Advised patient he may return if desired to follow-up with Ortho but we do not complete FMLA paperwork in the urgent care setting.   Final Clinical Impressions(s) / UC Diagnoses   Final diagnoses:  Left foot pain  Sprain of left foot, initial encounter     Discharge Instructions      SPRAIN: Stressed avoiding painful activities . Reviewed RICE guidelines. Use medications as directed, including NSAIDs. If no NSAIDs have been prescribed for you today, you may take Aleve or Motrin over the counter. May use Tylenol in between doses of NSAIDs.  If no improvement in the next 1-2 weeks, f/u with PCP or return to our office for reexamination, and please feel free to call or return at any time for any questions or concerns you may have and we will be happy to help you!        ED Prescriptions     Medication Sig Dispense Auth. Provider   naproxen (NAPROSYN) 500 MG tablet Take 1 tablet (500 mg total) by mouth 2 (two) times daily. 30 tablet Shirlee Latch, PA-C      I have reviewed the PDMP during this encounter.   Eusebio Friendly B, PA-C 02/21/23 [phone]

## 2023-02-21 NOTE — Discharge Instructions (Addendum)
SPRAIN: Stressed avoiding painful activities . Reviewed RICE guidelines. Use medications as directed, including NSAIDs. If no NSAIDs have been prescribed for you today, you may take Aleve or Motrin over the counter. May use Tylenol in between doses of NSAIDs.  If no improvement in the next 1-2 weeks, f/u with PCP or return to our office for reexamination, and please feel free to call or return at any time for any questions or concerns you may have and we will be happy to help you!
